# Patient Record
Sex: Male | Born: 1935 | Hispanic: Refuse to answer | Marital: Single
Health system: Midwestern US, Academic
[De-identification: ages and names within clinical notes are randomized; demographics above are authoritative.]

---

## 2017-05-11 ENCOUNTER — Encounter: Admit: 2017-05-11 | Discharge: 2017-05-11 | Payer: MEDICARE

## 2017-05-11 ENCOUNTER — Ambulatory Visit: Admit: 2017-05-11 | Discharge: 2017-05-12 | Payer: MEDICARE

## 2017-05-11 DIAGNOSIS — N399 Disorder of urinary system, unspecified: ICD-10-CM

## 2017-05-11 DIAGNOSIS — I10 Essential (primary) hypertension: ICD-10-CM

## 2017-05-11 DIAGNOSIS — E349 Endocrine disorder, unspecified: ICD-10-CM

## 2017-05-11 DIAGNOSIS — E782 Mixed hyperlipidemia: ICD-10-CM

## 2017-05-11 DIAGNOSIS — D611 Drug-induced aplastic anemia: ICD-10-CM

## 2017-05-11 DIAGNOSIS — I2510 Atherosclerotic heart disease of native coronary artery without angina pectoris: Principal | ICD-10-CM

## 2017-05-11 DIAGNOSIS — E785 Hyperlipidemia, unspecified: ICD-10-CM

## 2017-05-11 DIAGNOSIS — I25118 Atherosclerotic heart disease of native coronary artery with other forms of angina pectoris: ICD-10-CM

## 2017-05-11 NOTE — Progress Notes
Your health and safety are our highest priorities. In the office today, you were noted to have a fall occur in the last 6 months. We want to do whatever we can to prevent this from happening again in the future. Please be sure to use a walker or cane, if these were ordered for you, whenever you walk around at home and outside of your home.     I have included a copy of "What You Can Do To Prevent Falls" brochure. Please review this brochure and add as many recommendations as you can to your daily life. To protect you when you see us in the office, you should be accompanied by a staff member while walking throughout in our office. Please let us know if you have any questions or issues about this time by calling us at [phone].

## 2017-08-30 IMAGING — CT Head^1_MASTOIDS (Adult)
2 of 3 series · 4 of 9 positions shown, 5 images · non-contrast
Comparison: none

[Series 3: mastoids 0.6 coronal · coronal · 0.30mm/px · 1 of 3 slices shown]
[im 2/3  brain]
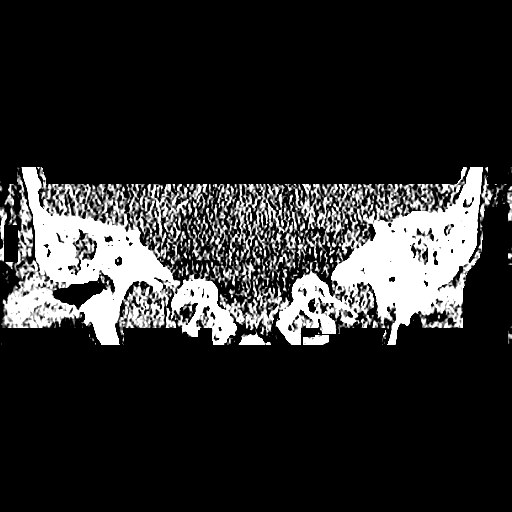

[Series 5: mastoids 0.6 rt mag · axial · 0.16mm/px · z∈[+99,+125]mm · 3 of 3 slices shown, 4 images]
[im 1/3  brain]
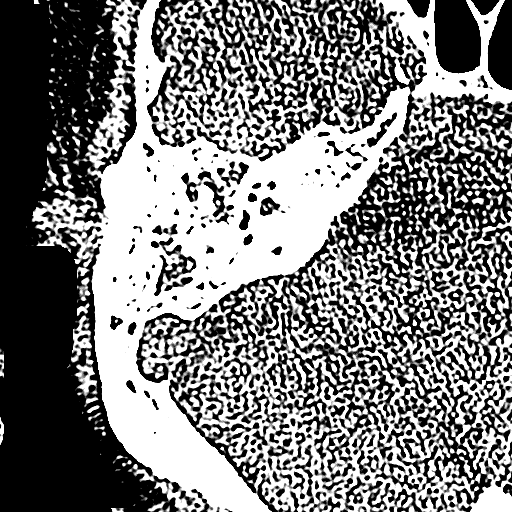
[im 1/3  bone]
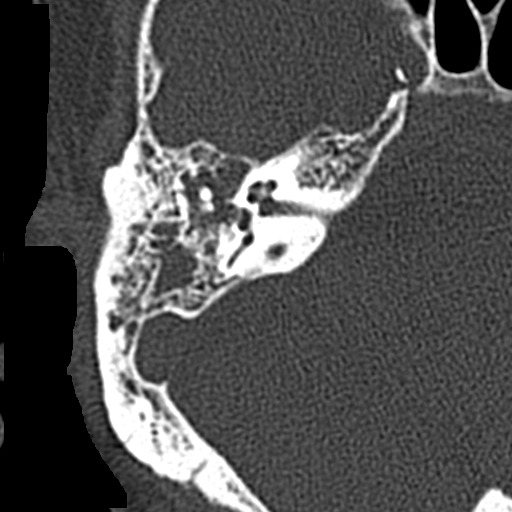
[im 2/3  brain]
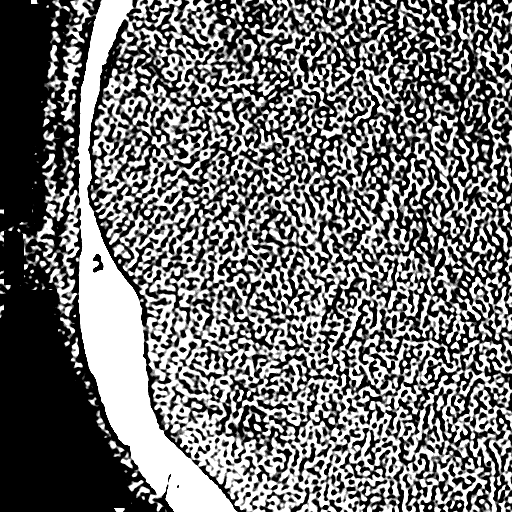
[im 3/3  brain]
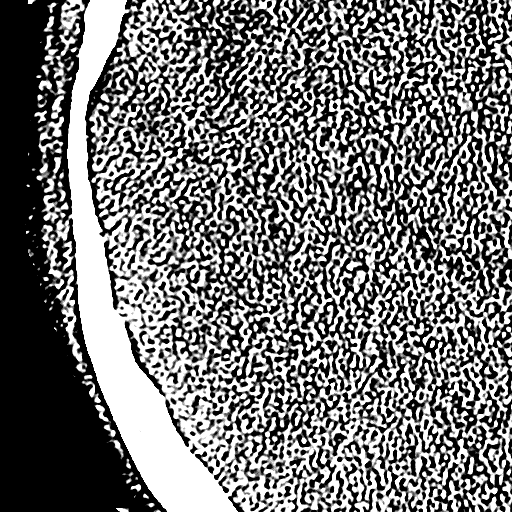

[4 of 9 positions shown; findings below may reference images not displayed]

EXAM

CT temporal bone without contrast

INDICATION

hearing loss
PT C/O HEARING LOSS. TJ

TECHNIQUE

Volumetric multi detector CT images of the temporal bones were obtained without the administration
of IV contrast.

All CT scans at this facility use dose modulation, iterative reconstruction, and/or weight based
dosing when appropriate to reduce radiation dose to as low as reasonably achievable.

COMPARISONS

None

FINDINGS

The partially visualized brain parenchyma is grossly normal in attenuation without evidence of
significant mass effect, fluid collection or midline shift. The paranasal sinuses demonstrate
minimal mucosal thickening. The orbits and their contents are within normal limits. There is no
definite displaced fracture.

RIGHT TEMPORAL BONE:

The external auditory canal is clear. There is thickening of the tympanic membrane. There is near
complete opacification of the epitympanum, mesotympanum and hypotympanum with retraction of the
tympanic membrane. There is opacification of the added is at antrum and mastoid air cells. The
auditory ossicles are otherwise grossly intact. The bony scutum is grossly intact. The internal
auditory canal, vestibule and semi circular canals are otherwise grossly within normal limits
without evidence of bony dehiscence. The bony courses of the internal carotid artery jugular vein
and 7th cranial nerve are grossly within normal limits.

LEFT TEMPORAL BONE:

The external auditory canal is clear. There is thickening of the tympanic membrane. There is near
complete opacification of the epitympanum, mesotympanum and hypotympanum with retraction of the
tympanic membrane. There is opacification of the added is at antrum and mastoid air cells. The
auditory ossicles are otherwise grossly intact. The bony scutum is grossly intact. The internal
auditory canal, vestibule and semi circular canals are otherwise grossly within normal limits
without evidence of bony dehiscence. The bony courses of the internal carotid artery jugular vein
and 7th cranial nerve are grossly within normal limits.

IMPRESSION

Severe of otomastoiditis changes of the bilateral middle ears and mastoid air cells without
evidence of definite bony dehiscence.

## 2017-11-24 LAB — LACTIC ACID(LACTATE): Lab: 1.4 — ABNORMAL LOW (ref 42.0–52.0)

## 2017-11-24 LAB — COMPREHENSIVE METABOLIC PANEL
Lab: 1
Lab: 104
Lab: 11
Lab: 137 — ABNORMAL LOW (ref 14.0–18.0)
Lab: 164 — ABNORMAL HIGH (ref 40–150)
Lab: 168 — ABNORMAL HIGH (ref 83–110)
Lab: 18
Lab: 26
Lab: 3.6
Lab: 6.5
Lab: 8.8 — ABNORMAL HIGH (ref 11.5–14.5)
Lab: 82 — ABNORMAL HIGH (ref 0–55)
Lab: 88 — ABNORMAL HIGH (ref 5–34)

## 2017-11-24 LAB — CBC
Lab: 2.7 — ABNORMAL LOW (ref 4.70–6.10)
Lab: 6.1

## 2017-12-07 ENCOUNTER — Encounter: Admit: 2017-12-07 | Discharge: 2017-12-07 | Payer: MEDICARE

## 2017-12-07 ENCOUNTER — Ambulatory Visit: Admit: 2017-12-07 | Discharge: 2017-12-08 | Payer: MEDICARE

## 2017-12-07 DIAGNOSIS — D611 Drug-induced aplastic anemia: ICD-10-CM

## 2017-12-07 DIAGNOSIS — E349 Endocrine disorder, unspecified: ICD-10-CM

## 2017-12-07 DIAGNOSIS — N399 Disorder of urinary system, unspecified: ICD-10-CM

## 2017-12-07 DIAGNOSIS — E785 Hyperlipidemia, unspecified: ICD-10-CM

## 2017-12-07 DIAGNOSIS — I10 Essential (primary) hypertension: Principal | ICD-10-CM

## 2017-12-07 DIAGNOSIS — E782 Mixed hyperlipidemia: ICD-10-CM

## 2017-12-07 DIAGNOSIS — I25118 Atherosclerotic heart disease of native coronary artery with other forms of angina pectoris: ICD-10-CM

## 2017-12-07 DIAGNOSIS — I2510 Atherosclerotic heart disease of native coronary artery without angina pectoris: ICD-10-CM

## 2017-12-31 ENCOUNTER — Encounter: Admit: 2017-12-31 | Discharge: 2017-12-31 | Payer: MEDICARE

## 2018-05-23 DEATH — deceased
